# Patient Record
Sex: Male | Born: 1980 | Race: White | Hispanic: No | Marital: Married | Smoking: Current every day smoker
Health system: Southern US, Community
[De-identification: ages and names within clinical notes are randomized; demographics above are authoritative.]

---

## 2007-03-24 ENCOUNTER — Emergency Department (HOSPITAL_COMMUNITY): Admission: EM | Admit: 2007-03-24 | Discharge: 2007-03-24 | Payer: Self-pay | Admitting: Emergency Medicine

## 2008-12-19 IMAGING — CR DG FEMUR 2V*L*
5 series · 5 of 5 positions shown · non-contrast
Comparison: none

CLINICAL DATA: Lateral thigh pain post fall.  
 LEFT FEMUR ? 2 VIEW:

[t femur with hip  ap left]
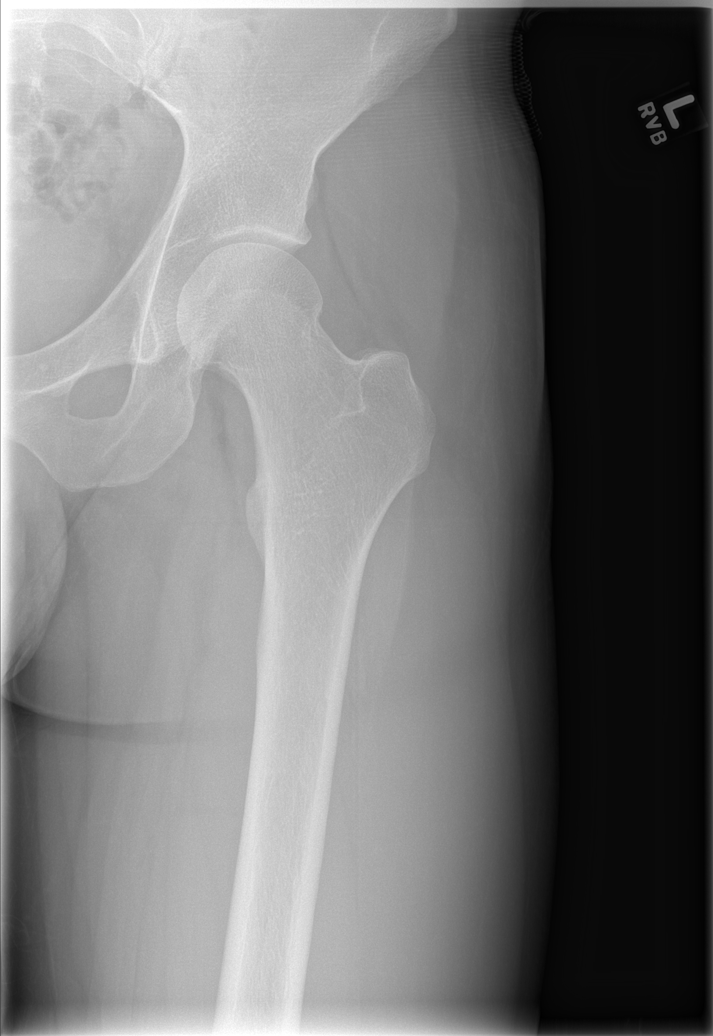

[t femur with knee ap left]
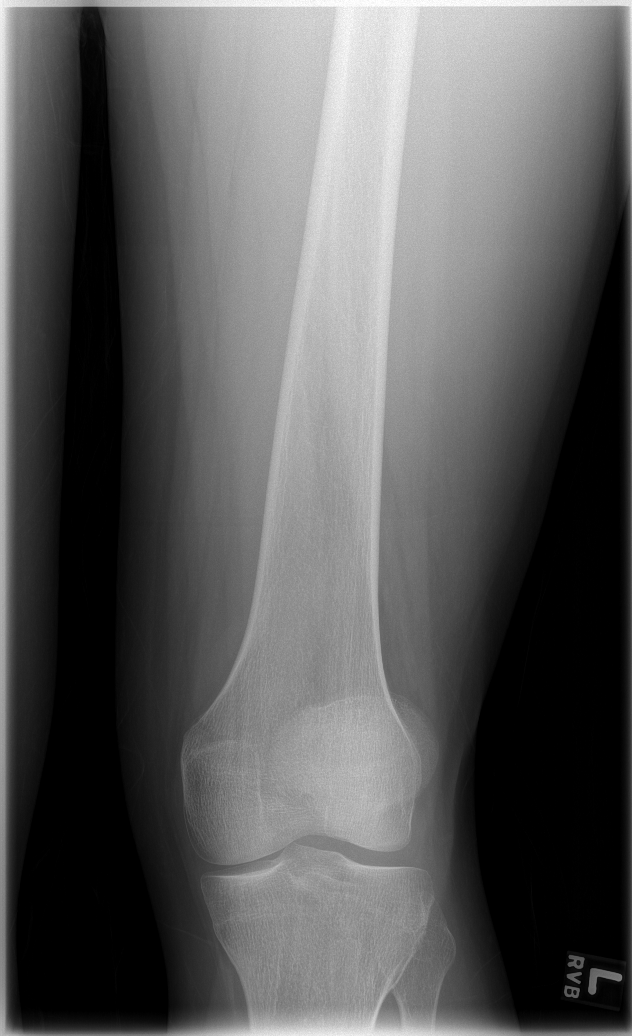

[t femur with hip lat left (1 of 2)]
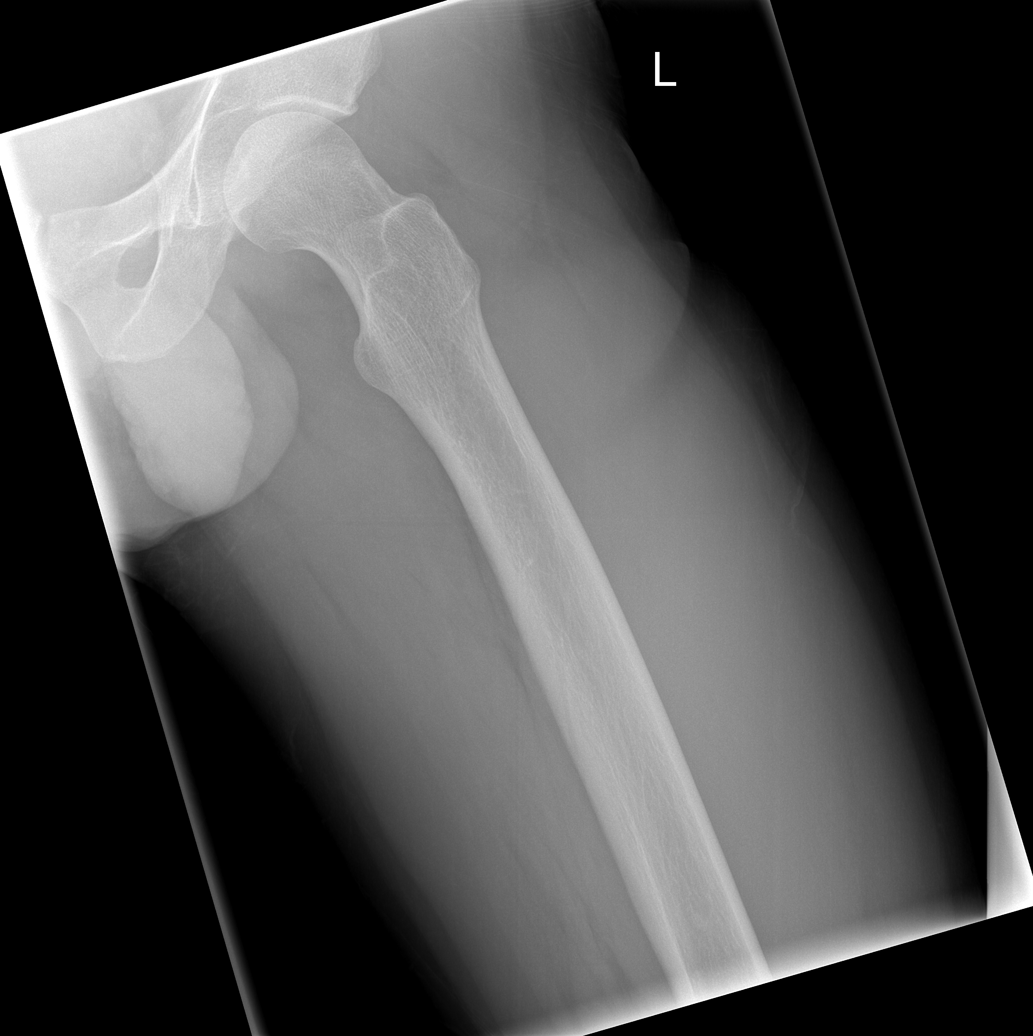

[t femur with hip lat left (2 of 2)]
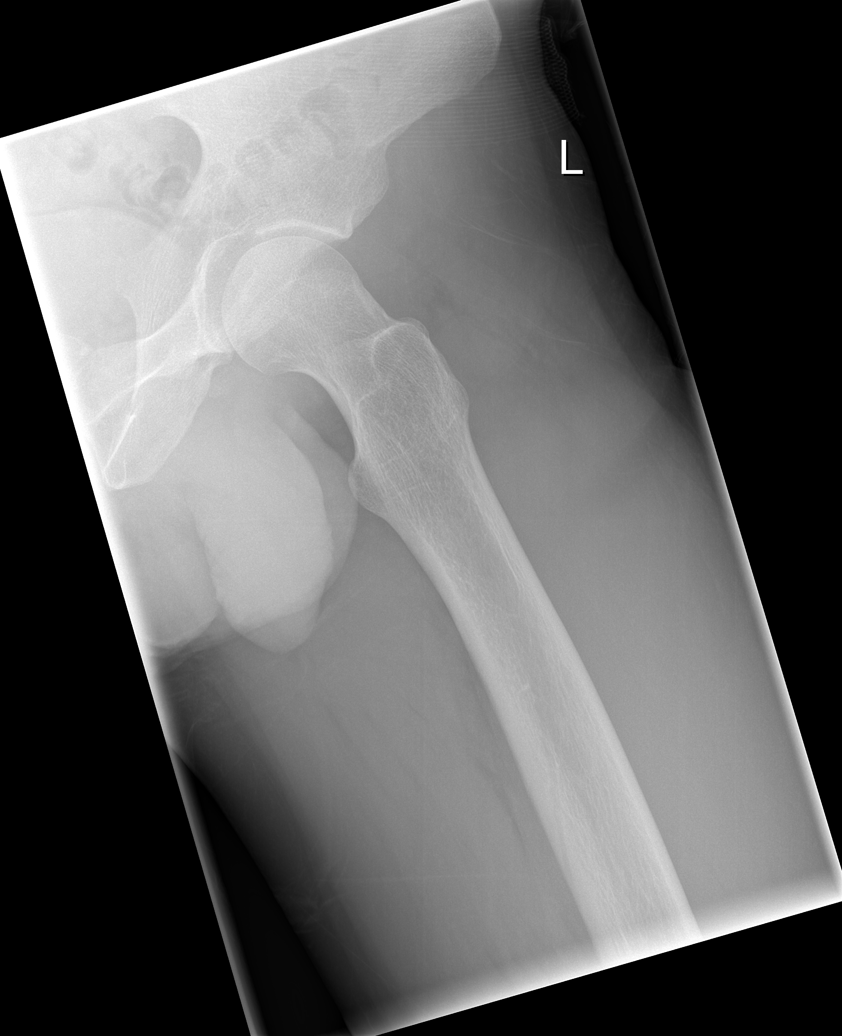

[t femur with knee lat left]
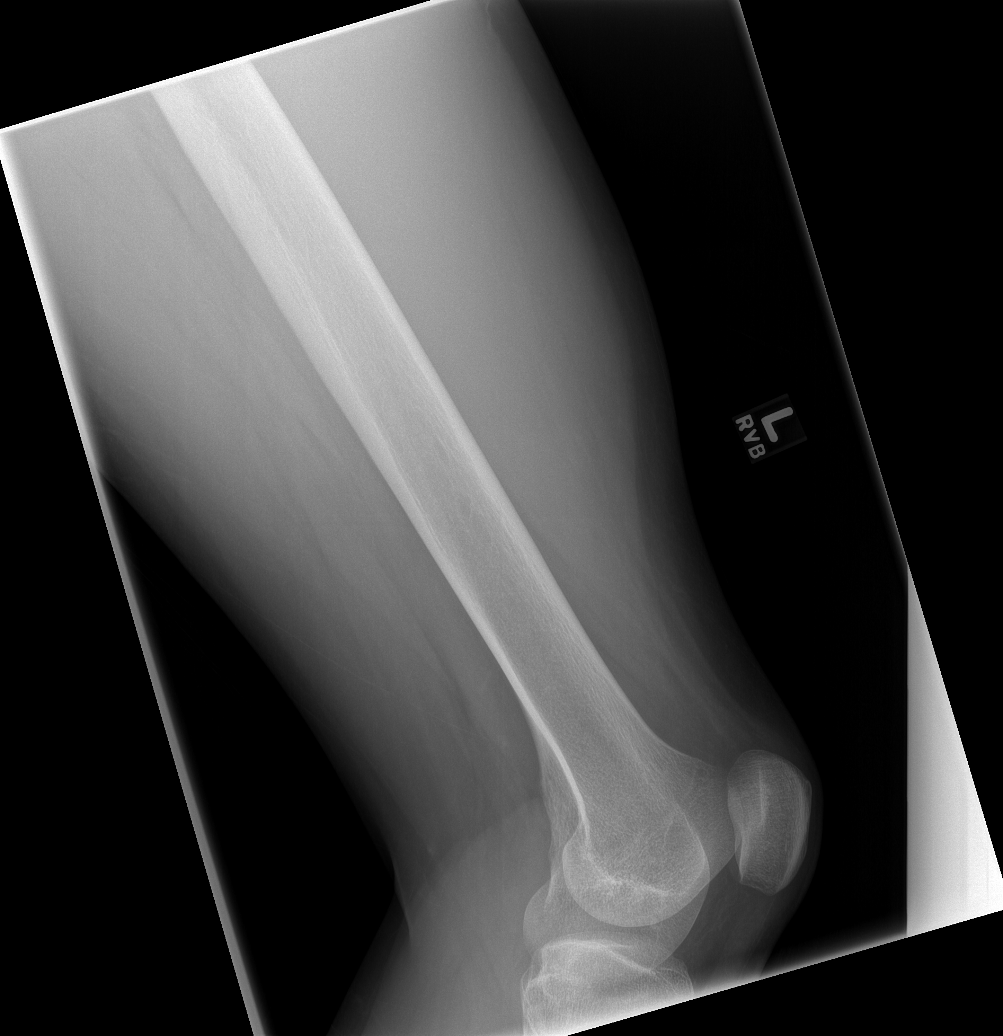

[5 of 5 positions shown; findings below may reference images not displayed]

FINDINGS: There is no evidence of fracture or other focal bone lesions.  Soft tissues are unremarkable.
IMPRESSION: Negative.

## 2011-06-17 ENCOUNTER — Telehealth: Payer: Self-pay | Admitting: Family Medicine

## 2011-06-17 NOTE — Telephone Encounter (Signed)
LEFT MSG

## 2013-02-25 ENCOUNTER — Telehealth: Payer: Self-pay | Admitting: Family Medicine

## 2013-02-25 NOTE — Telephone Encounter (Signed)
tsd  °

## 2013-03-06 ENCOUNTER — Ambulatory Visit (INDEPENDENT_AMBULATORY_CARE_PROVIDER_SITE_OTHER): Payer: BC Managed Care – PPO | Admitting: Family Medicine

## 2013-03-06 ENCOUNTER — Encounter: Payer: Self-pay | Admitting: Family Medicine

## 2013-03-06 ENCOUNTER — Other Ambulatory Visit: Payer: Self-pay

## 2013-03-06 VITALS — BP 120/80 | HR 90 | Ht 75.0 in | Wt 180.0 lb

## 2013-03-06 DIAGNOSIS — F988 Other specified behavioral and emotional disorders with onset usually occurring in childhood and adolescence: Secondary | ICD-10-CM

## 2013-03-06 DIAGNOSIS — F900 Attention-deficit hyperactivity disorder, predominantly inattentive type: Secondary | ICD-10-CM | POA: Insufficient documentation

## 2013-03-06 MED ORDER — AMPHETAMINE-DEXTROAMPHETAMINE 10 MG PO TABS: 10.0000 mg | ORAL_TABLET | Freq: Every day | ORAL | Status: DC

## 2013-03-06 NOTE — Patient Instructions (Signed)
Take the Adderall and I want to know if it works, how long doesn't work and do you have any problems with when you come off

## 2013-03-06 NOTE — Progress Notes (Signed)
  Subjective:    Patient ID: Marcus Cuevas, male    DOB: 1981/03/05, 32 y.o.   MRN: [id number]  HPI He is here for consultation concerning ADD. He was diagnosed by Dr. Delton Prairie as a child and tried on Ritalin as well as Adderall. He apparently did better on Adderall but still had difficulty with withdrawal symptoms when he came off especially becoming irritable. He is now in graduate school and is interested in getting back on medication. He needs coverage for roughly 12 hours due to his schedule. He was seen at the [facility] in testing [facility] for ADD however they could not prescribe the medication. He did bring in information from Dr. Jane Canary. His history indicates more inattention type. He was not hyper and usually remained quite calm but easily distracted.   Review of Systems     Objective:   Physical Exam Alert and in no distress. The information was reviewed.      Assessment & Plan:  ADD (attention deficit hyperactivity disorder, inattentive type) - Plan: amphetamine-dextroamphetamine (ADDERALL) 10 MG tablet  I discussed the use of the medication, if it works, how long and any potential side effects. We'll work with him concerning the appropriate dosing and coverage for 8-12 hours. He will call me next week with information.

## 2013-03-19 ENCOUNTER — Ambulatory Visit: Payer: BC Managed Care – PPO | Admitting: Family Medicine

## 2013-03-20 ENCOUNTER — Ambulatory Visit (INDEPENDENT_AMBULATORY_CARE_PROVIDER_SITE_OTHER): Payer: BC Managed Care – PPO | Admitting: Family Medicine

## 2013-03-20 VITALS — BP 116/78 | HR 59 | Wt 176.0 lb

## 2013-03-20 DIAGNOSIS — F988 Other specified behavioral and emotional disorders with onset usually occurring in childhood and adolescence: Secondary | ICD-10-CM

## 2013-03-20 DIAGNOSIS — F900 Attention-deficit hyperactivity disorder, predominantly inattentive type: Secondary | ICD-10-CM

## 2013-03-20 MED ORDER — AMPHETAMINE-DEXTROAMPHETAMINE 10 MG PO TABS: 10.0000 mg | ORAL_TABLET | Freq: Two times a day (BID) | ORAL | Status: DC

## 2013-03-20 NOTE — Progress Notes (Signed)
  Subjective:    Patient ID: Marcus Cuevas, male    DOB: Apr 14, 1981, 32 y.o.   MRN: [id number]  HPI He is here for consult concerning ADD. He did take the 10 mg dosing however it only lasted 4 hours and then he started taking it twice a day. He does note that he has heat before taking it otherwise he might not need for the whole day. He noticed no withdrawal symptoms at the end of the day.  Review of Systems     Objective:   Physical Exam Alert and in no distress       Assessment & Plan:  ADD (attention deficit hyperactivity disorder, inattentive type) - Plan: amphetamine-dextroamphetamine (ADDERALL) 10 MG tablet, amphetamine-dextroamphetamine (ADDERALL) 10 MG tablet, amphetamine-dextroamphetamine (ADDERALL) 10 MG tablet  he is doing quite well his present medication regimen. He has a good feel for how to use the Adderall and on days he does not need the full dosing he will not take it.

## 2013-07-16 ENCOUNTER — Telehealth: Payer: Self-pay | Admitting: Internal Medicine

## 2013-07-16 ENCOUNTER — Telehealth: Payer: Self-pay | Admitting: Family Medicine

## 2013-07-16 DIAGNOSIS — F900 Attention-deficit hyperactivity disorder, predominantly inattentive type: Secondary | ICD-10-CM

## 2013-07-16 MED ORDER — AMPHETAMINE-DEXTROAMPHETAMINE 10 MG PO TABS: 10.0000 mg | ORAL_TABLET | Freq: Two times a day (BID) | ORAL | Status: DC

## 2013-07-16 NOTE — Telephone Encounter (Signed)
Pt notified that rx's are ready. 

## 2013-07-16 NOTE — Telephone Encounter (Signed)
Adderall renewed.

## 2013-10-21 ENCOUNTER — Telehealth: Payer: Self-pay | Admitting: Family Medicine

## 2013-10-21 DIAGNOSIS — F900 Attention-deficit hyperactivity disorder, predominantly inattentive type: Secondary | ICD-10-CM

## 2013-10-21 MED ORDER — AMPHETAMINE-DEXTROAMPHETAMINE 10 MG PO TABS: 10.0000 mg | ORAL_TABLET | Freq: Two times a day (BID) | ORAL | Status: DC

## 2013-10-21 NOTE — Telephone Encounter (Signed)
lm

## 2013-10-21 NOTE — Telephone Encounter (Signed)
Pt stopped by to request adderall rx. Please call [phone] when ready.

## 2014-02-05 ENCOUNTER — Encounter: Payer: BC Managed Care – PPO | Admitting: Family Medicine

## 2014-03-12 ENCOUNTER — Ambulatory Visit (INDEPENDENT_AMBULATORY_CARE_PROVIDER_SITE_OTHER): Payer: 59 | Admitting: Family Medicine

## 2014-03-12 ENCOUNTER — Encounter: Payer: Self-pay | Admitting: Family Medicine

## 2014-03-12 DIAGNOSIS — F17200 Nicotine dependence, unspecified, uncomplicated: Secondary | ICD-10-CM

## 2014-03-12 NOTE — Progress Notes (Signed)
   Subjective:    Patient ID: Marcus Cuevas, male    DOB: [birth year], 33 y.o.   MRN: [id number]  HPI He is here for smoking consultation. He smokes a pack per day for the last 5 years 3 probably less than a 10-pack-year history. He has tried gum and e cigarette. He notes that boredom as well as anxiety cause him to smoke   Review of Systems     Objective:   Physical Exam Alert and in no distress otherwise not examined       Assessment & Plan:  Current smoker  smoking cessation information given in terms of habits versus addiction. Recommended he look at his smoking history and make a list of things to do instead of smoking for those times and places. Also discussed medications and he is not interested in this. He will set a quit date and return here in roughly one month for followup visit. Information given in the AVS

## 2014-03-12 NOTE — Patient Instructions (Signed)
Smoking Cessation Quitting smoking is important to your health and has many advantages. However, it is not always easy to quit since nicotine is a very addictive drug. Oftentimes, people try 3 times or more before being able to quit. This document explains the best ways for you to prepare to quit smoking. Quitting takes hard work and a lot of effort, but you can do it. ADVANTAGES OF QUITTING SMOKING  You will live longer, feel better, and live better.  Your body will feel the impact of quitting smoking almost immediately.  Within 20 minutes, blood pressure decreases. Your pulse returns to its normal level.  After 8 hours, carbon monoxide levels in the blood return to normal. Your oxygen level increases.  After 24 hours, the chance of having a heart attack starts to decrease. Your breath, hair, and body stop smelling like smoke.  After 48 hours, damaged nerve endings begin to recover. Your sense of taste and smell improve.  After 72 hours, the body is virtually free of nicotine. Your bronchial tubes relax and breathing becomes easier.  After 2 to 12 weeks, lungs can hold more air. Exercise becomes easier and circulation improves.  The risk of having a heart attack, stroke, cancer, or lung disease is greatly reduced.  After 1 year, the risk of coronary heart disease is cut in half.  After 5 years, the risk of stroke falls to the same as a nonsmoker.  After 10 years, the risk of lung cancer is cut in half and the risk of other cancers decreases significantly.  After 15 years, the risk of coronary heart disease drops, usually to the level of a nonsmoker.  If you are pregnant, quitting smoking will improve your chances of having a healthy baby.  The people you live with, especially any children, will be healthier.  You will have extra money to spend on things other than cigarettes. QUESTIONS TO THINK ABOUT BEFORE ATTEMPTING TO QUIT You may want to talk about your answers with your  health care provider.  Why do you want to quit?  If you tried to quit in the past, what helped and what did not?  What will be the most difficult situations for you after you quit? How will you plan to handle them?  Who can help you through the tough times? Your family? Friends? A health care provider?  What pleasures do you get from smoking? What ways can you still get pleasure if you quit? Here are some questions to ask your health care provider:  How can you help me to be successful at quitting?  What medicine do you think would be best for me and how should I take it?  What should I do if I need more help?  What is smoking withdrawal like? How can I get information on withdrawal? GET READY  Set a quit date.  Change your environment by getting rid of all cigarettes, ashtrays, matches, and lighters in your home, car, or work. Do not let people smoke in your home.  Review your past attempts to quit. Think about what worked and what did not. GET SUPPORT AND ENCOURAGEMENT You have a better chance of being successful if you have help. You can get support in many ways.  Tell your family, friends, and coworkers that you are going to quit and need their support. Ask them not to smoke around you.  Get individual, group, or telephone counseling and support. Programs are available at local hospitals and health centers. Call   your local health department for information about programs in your area.  Spiritual beliefs and practices may help some smokers quit.  Download a "quit meter" on your computer to keep track of quit statistics, such as how long you have gone without smoking, cigarettes not smoked, and money saved.  Get a self-help book about quitting smoking and staying off tobacco. LEARN NEW SKILLS AND BEHAVIORS  Distract yourself from urges to smoke. Talk to someone, go for a walk, or occupy your time with a task.  Change your normal routine. Take a different route to work.  Drink tea instead of coffee. Eat breakfast in a different place.  Reduce your stress. Take a hot bath, exercise, or read a book.  Plan something enjoyable to do every day. Reward yourself for not smoking.  Explore interactive web-based programs that specialize in helping you quit. GET MEDICINE AND USE IT CORRECTLY Medicines can help you stop smoking and decrease the urge to smoke. Combining medicine with the above behavioral methods and support can greatly increase your chances of successfully quitting smoking.  Nicotine replacement therapy helps deliver nicotine to your body without the negative effects and risks of smoking. Nicotine replacement therapy includes nicotine gum, lozenges, inhalers, nasal sprays, and skin patches. Some may be available over-the-counter and others require a prescription.  Antidepressant medicine helps people abstain from smoking, but how this works is unknown. This medicine is available by prescription.  Nicotinic receptor partial agonist medicine simulates the effect of nicotine in your brain. This medicine is available by prescription. Ask your health care provider for advice about which medicines to use and how to use them based on your health history. Your health care provider will tell you what side effects to look out for if you choose to be on a medicine or therapy. Carefully read the information on the package. Do not use any other product containing nicotine while using a nicotine replacement product.  RELAPSE OR DIFFICULT SITUATIONS Most relapses occur within the first 3 months after quitting. Do not be discouraged if you start smoking again. Remember, most people try several times before finally quitting. You may have symptoms of withdrawal because your body is used to nicotine. You may crave cigarettes, be irritable, feel very hungry, cough often, get headaches, or have difficulty concentrating. The withdrawal symptoms are only temporary. They are strongest  when you first quit, but they will go away within 10-14 days. To reduce the chances of relapse, try to:  Avoid drinking alcohol. Drinking lowers your chances of successfully quitting.  Reduce the amount of caffeine you consume. Once you quit smoking, the amount of caffeine in your body increases and can give you symptoms, such as a rapid heartbeat, sweating, and anxiety.  Avoid smokers because they can make you want to smoke.  Do not let weight gain distract you. Many smokers will gain weight when they quit, usually less than 10 pounds. Eat a healthy diet and stay active. You can always lose the weight gained after you quit.  Find ways to improve your mood other than smoking. FOR MORE INFORMATION  www.smokefree.gov  Document Released: 05/24/2001 Document Revised: 10/14/2013 Document Reviewed: 09/08/2011 [facility] Patient Information 2015 [place], [place]. This information is not intended to replace advice given to you by your health care provider. Make sure you discuss any questions you have with your health care provider. Identify the triggers. Make a list of things to do instead of smoking. Carried that list with you. Set a quit date.  You can get into a contest with whomever if that'll help but she also to come back and see me. 2 biggest reasons the people start smoking again are that you get stressed or that say I'll just have one. Big no no

## 2014-03-27 ENCOUNTER — Ambulatory Visit: Payer: 59 | Admitting: Family Medicine

## 2014-04-03 ENCOUNTER — Encounter: Payer: Self-pay | Admitting: Family Medicine

## 2014-08-25 ENCOUNTER — Ambulatory Visit (INDEPENDENT_AMBULATORY_CARE_PROVIDER_SITE_OTHER): Payer: 59 | Admitting: Family Medicine

## 2014-08-25 ENCOUNTER — Encounter: Payer: Self-pay | Admitting: Family Medicine

## 2014-08-25 VITALS — BP 120/78 | HR 70 | Wt 180.0 lb

## 2014-08-25 DIAGNOSIS — Z720 Tobacco use: Secondary | ICD-10-CM | POA: Diagnosis not present

## 2014-08-25 DIAGNOSIS — F900 Attention-deficit hyperactivity disorder, predominantly inattentive type: Secondary | ICD-10-CM | POA: Diagnosis not present

## 2014-08-25 DIAGNOSIS — F17200 Nicotine dependence, unspecified, uncomplicated: Secondary | ICD-10-CM | POA: Insufficient documentation

## 2014-08-25 MED ORDER — AMPHETAMINE-DEXTROAMPHETAMINE 10 MG PO TABS: 10.0000 mg | ORAL_TABLET | Freq: Two times a day (BID) | ORAL | Status: DC

## 2014-08-25 NOTE — Progress Notes (Signed)
   Subjective:    Patient ID: Marcus Cuevas, male    DOB: 1980-11-27, 34 y.o.   MRN: [id number]  HPI He is here for a recheck. He does have underlying ADD. He did try to not use his medication but found that he was more easily distracted and unable stay focused. This did cause some trouble at work. He now realizes the need to stay on the medicine regularly especially at work. He has cut down his smoking to 10 cigarettes per day. He just found out that his wife is pregnant and having a little girl. He is going to try to get a job with less travel involved.   Review of Systems     Objective:   Physical Exam Alert and in no distress otherwise not examined      Assessment & Plan:  ADD (attention deficit hyperactivity disorder, inattentive type) - Plan: amphetamine-dextroamphetamine (ADDERALL) 10 MG tablet, amphetamine-dextroamphetamine (ADDERALL) 10 MG tablet, amphetamine-dextroamphetamine (ADDERALL) 10 MG tablet  Current smoker Discussed smoking cessation with him. Reinforced the fact that at this point the smoking is more psychological triggers and truly addiction. He will attempt to continue to cut back. Recommend he come in for complete exam in sometime in the near future.

## 2015-01-14 ENCOUNTER — Telehealth: Payer: Self-pay | Admitting: Family Medicine

## 2015-01-14 DIAGNOSIS — F900 Attention-deficit hyperactivity disorder, predominantly inattentive type: Secondary | ICD-10-CM

## 2015-01-14 NOTE — Telephone Encounter (Signed)
Pt called requesting a refill on his ADDERALL med. Call pt at [phone], when pres  ready

## 2015-01-15 MED ORDER — AMPHETAMINE-DEXTROAMPHETAMINE 10 MG PO TABS: 10.0000 mg | ORAL_TABLET | Freq: Two times a day (BID) | ORAL | Status: DC

## 2015-02-10 ENCOUNTER — Encounter: Payer: Self-pay | Admitting: Family Medicine

## 2015-02-10 ENCOUNTER — Ambulatory Visit (INDEPENDENT_AMBULATORY_CARE_PROVIDER_SITE_OTHER): Payer: 59 | Admitting: Family Medicine

## 2015-02-10 VITALS — BP 112/60 | HR 65 | Ht 74.0 in | Wt 169.6 lb

## 2015-02-10 DIAGNOSIS — Z720 Tobacco use: Secondary | ICD-10-CM | POA: Diagnosis not present

## 2015-02-10 DIAGNOSIS — J3089 Other allergic rhinitis: Secondary | ICD-10-CM | POA: Diagnosis not present

## 2015-02-10 DIAGNOSIS — F17200 Nicotine dependence, unspecified, uncomplicated: Secondary | ICD-10-CM

## 2015-02-10 DIAGNOSIS — Z0000 Encounter for general adult medical examination without abnormal findings: Secondary | ICD-10-CM | POA: Diagnosis not present

## 2015-02-10 DIAGNOSIS — Z23 Encounter for immunization: Secondary | ICD-10-CM | POA: Diagnosis not present

## 2015-02-10 DIAGNOSIS — F900 Attention-deficit hyperactivity disorder, predominantly inattentive type: Secondary | ICD-10-CM

## 2015-02-10 NOTE — Progress Notes (Signed)
Subjective:    Patient ID: Marcus Cuevas, male    DOB: 11-09-1980, 34 y.o.   MRN: [id number]  HPI He is here for complete examination. He does have underlying ADD and is doing quite well on his present medication regimen. He does take it twice per day. He has no withdrawal symptoms in between dosing. He does smoke and at this time is not ready to quit. His allergies are under good control using Zyrtec. He is a new father of a bouncing baby girl! He is in [employer] and presently is involved in [employer] near [place].He did have recent blood work done through [facility] which he will bring in. Apparently it was normal. Family and social history was reviewed as well as health maintenance and immunizations. He has no chest pain, GI symptoms.   Review of Systems  All other systems reviewed and are negative.      Objective:   Physical Exam BP 112/60 mmHg  Pulse 65  Ht 6\' 2"  (1.88 m)  Wt 169 lb 9.6 oz (76.93 kg)  BMI 21.77 kg/m2  SpO2 98%  General Appearance:    Alert, cooperative, no distress, appears stated age  Head:    Normocephalic, without obvious abnormality, atraumatic  Eyes:    PERRL, conjunctiva/corneas clear, EOM's intact, fundi    benign  Ears:    Normal TM's and external ear canals  Nose:   Nares normal, mucosa normal, no drainage or sinus   tenderness  Throat:   Lips, mucosa, and tongue normal; teeth and gums normal  Neck:   Supple, no lymphadenopathy;  thyroid:  no   enlargement/tenderness/nodules; no carotid   bruit or JVD  Back:    Spine nontender, no curvature, ROM normal, no CVA     tenderness  Lungs:     Clear to auscultation bilaterally without wheezes, rales or     ronchi; respirations unlabored  Chest Wall:    No tenderness or deformity   Heart:    Regular rate and rhythm, S1 and S2 normal, no murmur, rub   or gallop  Breast Exam:    No chest wall tenderness, masses or gynecomastia  Abdomen:     Soft, non-tender, nondistended, normoactive bowel sounds,    no masses,  no hepatosplenomegaly  Genitalia:    Normal male external genitalia without lesions.  Testicles without masses.  No inguinal hernias.  Rectal:   Deferred due to age <40 and lack of symptoms  Extremities:   No clubbing, cyanosis or edema  Pulses:   2+ and symmetric all extremities  Skin:   Skin color, texture, turgor normal, no rashes or lesions  Lymph nodes:   Cervical, supraclavicular, and axillary nodes normal  Neurologic:   CNII-XII intact, normal strength, sensation and gait; reflexes 2+ and symmetric throughout          Psych:   Normal mood, affect, hygiene and grooming.          Assessment & Plan:  ADD (attention deficit hyperactivity disorder, inattentive type)  Current smoker  Need for prophylactic vaccination and inoculation against influenza - Plan: Flu Vaccine QUAD 36+ mos IM  Other allergic rhinitis At this time he is not ready to quit smoking and encouraged him to quit when he is ready and I will work with him.( child-rearing with him. He seems to have a good handle on it. His comment to me was his parents told him to make himself as useless as quick as possible in regard to  raising a child. Flu shot given with risks and benefits discussed.

## 2015-02-17 ENCOUNTER — Encounter: Payer: Self-pay | Admitting: Family Medicine

## 2015-03-04 ENCOUNTER — Ambulatory Visit (INDEPENDENT_AMBULATORY_CARE_PROVIDER_SITE_OTHER): Payer: 59 | Admitting: Family Medicine

## 2015-03-04 DIAGNOSIS — Z720 Tobacco use: Secondary | ICD-10-CM

## 2015-03-04 DIAGNOSIS — F17200 Nicotine dependence, unspecified, uncomplicated: Secondary | ICD-10-CM

## 2015-03-04 DIAGNOSIS — Z8349 Family history of other endocrine, nutritional and metabolic diseases: Secondary | ICD-10-CM

## 2015-03-04 NOTE — Progress Notes (Signed)
   Subjective:    Patient ID: Marcus Cuevas, male    DOB: [birth year], 34 y.o.   MRN: [id number]  HPI He is here for consultation. There is a positive family  history of Hemochromatosis. He would like to have testing done for this. He also is here for smoking consultation. He apparently needs this to get a lower insurance rate. He readily admits that he is not ready to quit smoking. He has a 97-month-old son at home which has him quite stressed. He plans to quit in one year. Presently is smoking 1 pack per day. He is not interested in using medication to help with this.   Review of Systems     Objective:   Physical Exam Alert and in no distress otherwise not examined       Assessment & Plan:  Family history of hemochromatosis  Current smoker A prescription was written for him to get CBC, Cmet, iron and ferritin. He will have this information sent to me. We also then discussed smoking cessation. Discussed habit versus addiction. Also discussed positive reinforcement to help with smoking cessation and recommend he return here when he is truly ready to quit smoking.

## 2015-03-19 ENCOUNTER — Telehealth: Payer: Self-pay | Admitting: Family Medicine

## 2015-03-19 NOTE — Telephone Encounter (Signed)
Let him know that the form is ready to pick up

## 2015-03-19 NOTE — Telephone Encounter (Signed)
Pt dropped of a health screening form to be filled out from you, pt can be reached at [phone] (M) to let know when ready to be ready to be picked up, put your folder,

## 2015-03-23 NOTE — Telephone Encounter (Signed)
Left message with wife that form ready for pick up

## 2015-04-01 ENCOUNTER — Telehealth: Payer: Self-pay | Admitting: Family Medicine

## 2015-04-01 NOTE — Telephone Encounter (Signed)
Pt came in and dropped off his health screening form from his employer. He stated they would not except the part under section c that is required. He states that his employer wanted specifics on a plan for quitting smoking. I am sending form back to be addressed.

## 2015-04-03 NOTE — Telephone Encounter (Signed)
Form ready for pick up, pt informed

## 2015-04-13 ENCOUNTER — Telehealth: Payer: Self-pay | Admitting: Family Medicine

## 2015-04-13 DIAGNOSIS — F900 Attention-deficit hyperactivity disorder, predominantly inattentive type: Secondary | ICD-10-CM

## 2015-04-13 MED ORDER — AMPHETAMINE-DEXTROAMPHETAMINE 10 MG PO TABS: 10.0000 mg | ORAL_TABLET | Freq: Two times a day (BID) | ORAL | Status: DC

## 2015-04-13 NOTE — Telephone Encounter (Signed)
Pt called and requested refills on Adderall . Call when ready.

## 2015-07-20 ENCOUNTER — Telehealth: Payer: Self-pay | Admitting: Medical

## 2015-07-20 NOTE — Telephone Encounter (Signed)
Pt states has been on ADD meds since was a kid, has tried Ritalin & Adderall XR Switched pharmacy to [facility] My meds states approved, pt informed

## 2015-08-28 ENCOUNTER — Encounter: Payer: Self-pay | Admitting: Family Medicine

## 2015-08-28 ENCOUNTER — Ambulatory Visit (INDEPENDENT_AMBULATORY_CARE_PROVIDER_SITE_OTHER): Payer: 59 | Admitting: Family Medicine

## 2015-08-28 VITALS — BP 130/82 | HR 78 | Ht 74.0 in | Wt 174.0 lb

## 2015-08-28 DIAGNOSIS — F17200 Nicotine dependence, unspecified, uncomplicated: Secondary | ICD-10-CM

## 2015-08-28 DIAGNOSIS — Z720 Tobacco use: Secondary | ICD-10-CM

## 2015-08-28 DIAGNOSIS — F900 Attention-deficit hyperactivity disorder, predominantly inattentive type: Secondary | ICD-10-CM

## 2015-08-28 MED ORDER — AMPHETAMINE-DEXTROAMPHETAMINE 10 MG PO TABS: 10.0000 mg | ORAL_TABLET | Freq: Two times a day (BID) | ORAL | Status: DC

## 2015-08-28 NOTE — Progress Notes (Signed)
   Subjective:    Patient ID: Marcus Cuevas, male    DOB: 1981-03-06, 35 y.o.   MRN: [id number]  HPI He is for recheck on his ADD. He takes 1 pill in the morning and a half a pill in the afternoon and gets 8 hours worth of benefit out of this. He also supplements he afternoon dosing with caffeine. He is very comfortable with this but might consider making a change when he goes back to school. He also is considering quitting smoking sometime this year. He is researching this on line and seems to have a good understanding of this. He does not smoke in his house, car or around his 6162-month-old child.   Review of Systems     Objective:   Physical Exam Alert and in no distress otherwise not examined      Assessment & Plan:  ADD (attention deficit hyperactivity disorder, inattentive type) - Plan: amphetamine-dextroamphetamine (ADDERALL) 10 MG tablet, amphetamine-dextroamphetamine (ADDERALL) 10 MG tablet, amphetamine-dextroamphetamine (ADDERALL) 10 MG tablet  Current smoker his Adderall was renewed. 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 Encouraged him to call if he has difficulty with the smoking. Discussed habit versus addiction with him.

## 2015-12-09 ENCOUNTER — Encounter: Payer: Self-pay | Admitting: Family Medicine

## 2015-12-09 ENCOUNTER — Ambulatory Visit (INDEPENDENT_AMBULATORY_CARE_PROVIDER_SITE_OTHER): Payer: 59 | Admitting: Family Medicine

## 2015-12-09 VITALS — BP 118/80 | HR 71 | Wt 181.0 lb

## 2015-12-09 DIAGNOSIS — F17200 Nicotine dependence, unspecified, uncomplicated: Secondary | ICD-10-CM

## 2015-12-09 DIAGNOSIS — F900 Attention-deficit hyperactivity disorder, predominantly inattentive type: Secondary | ICD-10-CM | POA: Diagnosis not present

## 2015-12-09 DIAGNOSIS — Z720 Tobacco use: Secondary | ICD-10-CM

## 2015-12-09 MED ORDER — AMPHETAMINE-DEXTROAMPHETAMINE 10 MG PO TABS: ORAL_TABLET | ORAL | Status: DC

## 2015-12-09 NOTE — Progress Notes (Signed)
   Subjective:    Patient ID: Marcus Cuevas, male    DOB: 07-May-1981, 35 y.o.   MRN: [id number]  HPI He is here for medication management. He continues on Adderall. He is using 1-1/2 in the morning and 1 at lunchtime he gets 8 hours of benefit out of this. He has no problems with eating or withdrawal symptoms. He is very happy with the present dosing regimen. He continues to smoke and is contemplating quitting but not ready yet. He does not smoke in the house or around his 10955-month-old daughter.   Review of Systems     Objective:   Physical Exam Alert and in no distress otherwise not examined       Assessment & Plan:  ADD (attention deficit hyperactivity disorder, inattentive type) - Plan: amphetamine-dextroamphetamine (ADDERALL) 10 MG tablet, amphetamine-dextroamphetamine (ADDERALL) 10 MG tablet, amphetamine-dextroamphetamine (ADDERALL) 10 MG tablet  Current smoker His medications were renewed. Discussed smoking cessation with him and encouraged him to call me when he is ready and I will work with him.

## 2016-03-21 ENCOUNTER — Telehealth: Payer: Self-pay | Admitting: Family Medicine

## 2016-03-21 MED ORDER — AMPHETAMINE-DEXTROAMPHETAMINE 10 MG PO TABS
ORAL_TABLET | ORAL | 0 refills | Status: DC
Start: 2016-04-21 — End: 2016-08-12

## 2016-03-21 MED ORDER — AMPHETAMINE-DEXTROAMPHETAMINE 10 MG PO TABS: ORAL_TABLET | ORAL | 0 refills | Status: DC

## 2016-03-21 NOTE — Telephone Encounter (Signed)
Requesting refill on Adderall. Call pt at [phone] when scripts ready for pick up

## 2016-03-23 ENCOUNTER — Encounter: Payer: Self-pay | Admitting: Family Medicine

## 2016-03-23 ENCOUNTER — Ambulatory Visit (INDEPENDENT_AMBULATORY_CARE_PROVIDER_SITE_OTHER): Payer: 59 | Admitting: Family Medicine

## 2016-03-23 VITALS — BP 112/70 | HR 103 | Wt 168.0 lb

## 2016-03-23 DIAGNOSIS — F17200 Nicotine dependence, unspecified, uncomplicated: Secondary | ICD-10-CM

## 2016-03-23 NOTE — Progress Notes (Signed)
   Subjective:    Patient ID: Marcus Cuevas, male    DOB: 06-Jul-1980, 35 y.o.   MRN: [id number]  HPI He is here for consult concerning smoking cessation. 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 He has not set up a plan for this. He uses smoking as a stress reducer but then further questioning indicates that he realizes that he is to set this up as a way to remove himself from having to deal with people. He recognizes the need to occasionally get away from his coworkers and off on his own and uses cigarette smoking as the crutch to do this.   Review of Systems     Objective:   Physical Exam Alert and in no distress otherwise not examined       Assessment & Plan:  Current smoker  I discussed habit versus addiction with him. I'll though he smokes a pack per day and not sure that he truly has an addiction to the nicotine. He so my recognizes the fact that he is using this mainly as a way to get away from people to have a break but not necessarily due to an unusual amount of stress. I discussed the normal stresses of working versus distress. He admits that this is not really distress. Recommend that he return the next year when he is ready to quit smoking and I will work with him on again plan. A form was filled out explaining this.

## 2016-05-17 ENCOUNTER — Encounter: Payer: 59 | Admitting: Family Medicine

## 2016-08-12 ENCOUNTER — Ambulatory Visit (INDEPENDENT_AMBULATORY_CARE_PROVIDER_SITE_OTHER): Payer: 59 | Admitting: Family Medicine

## 2016-08-12 ENCOUNTER — Encounter: Payer: Self-pay | Admitting: Family Medicine

## 2016-08-12 VITALS — BP 110/70 | HR 76 | Ht 74.0 in | Wt 181.0 lb

## 2016-08-12 DIAGNOSIS — F17200 Nicotine dependence, unspecified, uncomplicated: Secondary | ICD-10-CM | POA: Diagnosis not present

## 2016-08-12 DIAGNOSIS — F900 Attention-deficit hyperactivity disorder, predominantly inattentive type: Secondary | ICD-10-CM

## 2016-08-12 MED ORDER — AMPHETAMINE-DEXTROAMPHETAMINE 10 MG PO TABS: ORAL_TABLET | ORAL | 0 refills | Status: DC

## 2016-08-12 NOTE — Progress Notes (Signed)
   Subjective:    Patient ID: Marcus Cuevas, male    DOB: 10/29/1980, 36 y.o.   MRN: [id number]  HPI He is here for recheck. He has had ADHD since childhood. He has been doing quite nicely on 1-1/2 in the morning and 1 at bedtime. He notes that if he goes to 1-1/2 twice per day, he will have difficulty with sleep at night. He is very happy with his present dosing regimen. He continues to smoke but states he has cut back.  Review of Systems     Objective:   Physical Exam Alert and in no distress otherwise not examined       Assessment & Plan:  Current smoker  Attention deficit hyperactivity disorder (ADHD), predominantly inattentive type  I again stated that I would work with him concerning smoking when he is ready. I will renew his medication. He will check on Adderall XR coverage and we might possibly switch him to that.

## 2016-08-12 NOTE — Patient Instructions (Signed)
Check on the cost of Adderall XR 20 mg

## 2016-08-13 ENCOUNTER — Telehealth: Payer: Self-pay | Admitting: Family Medicine

## 2016-08-13 NOTE — Telephone Encounter (Signed)
P.A. AMPHETAMINE-DEX  

## 2016-08-21 NOTE — Telephone Encounter (Signed)
P.A. Approved, left message for pt, faxed pharmacy  °

## 2016-11-14 ENCOUNTER — Telehealth: Payer: Self-pay | Admitting: Family Medicine

## 2016-11-14 MED ORDER — AMPHETAMINE-DEXTROAMPHETAMINE 10 MG PO TABS: ORAL_TABLET | ORAL | 0 refills | Status: DC

## 2016-11-14 NOTE — Telephone Encounter (Signed)
Pt called and is requesting a refill on his adderall pt can be reached at [phone] please call when ready to be picked up

## 2017-04-03 ENCOUNTER — Ambulatory Visit (INDEPENDENT_AMBULATORY_CARE_PROVIDER_SITE_OTHER): Payer: 59 | Admitting: Family Medicine

## 2017-04-03 ENCOUNTER — Encounter: Payer: Self-pay | Admitting: Family Medicine

## 2017-04-03 VITALS — BP 110/78 | HR 91 | Ht 75.0 in | Wt 181.6 lb

## 2017-04-03 DIAGNOSIS — F17200 Nicotine dependence, unspecified, uncomplicated: Secondary | ICD-10-CM

## 2017-04-03 DIAGNOSIS — F900 Attention-deficit hyperactivity disorder, predominantly inattentive type: Secondary | ICD-10-CM | POA: Diagnosis not present

## 2017-04-03 MED ORDER — LISDEXAMFETAMINE DIMESYLATE 50 MG PO CAPS: 50.0000 mg | ORAL_CAPSULE | Freq: Every day | ORAL | 0 refills | Status: DC

## 2017-04-03 NOTE — Progress Notes (Signed)
   Subjective:    Patient ID: Jorje GuildJohn Thien, male    DOB: 03-Apr-1981, 36 y.o.   MRN: [id number]  HPI He is here for a medication check. He does state Adderall does s focus but iake him slightly nervous which causes his now on the process of trying to break this. He has stopped drinking alcohol and has made some dietary changes to ith that. He is also stopped his Adderall but notes difficulty with his focus.he is interested in alternatives. His work and home life are going well.   Review of Systems     Objective:   Physical Exam Alert and in no distress otherwise not examined       Assessment & Plan:  Attention deficit hyperactivity disorder (ADHD), predominantly inattentive type - Plan: lisdexamfetamine (VYVANSE) 50 MG capsule  Current smoker I discussed smoking cessation with him in regard to his ADD medication. I will switch him to Vyvanse and see how he tolerates this. He will let me know if it works, how long and if any difficulties. I will also work with him concerning smoking cessation.

## 2017-04-03 NOTE — Patient Instructions (Signed)
Need to know if it works, how long it works and if you have any trouble.

## 2017-05-15 ENCOUNTER — Encounter: Payer: 59 | Admitting: Family Medicine

## 2017-05-27 ENCOUNTER — Telehealth: Payer: Self-pay | Admitting: Family Medicine

## 2017-06-17 NOTE — Telephone Encounter (Signed)
P.A. Approved til 05/27/18, pt informed, already picked up

## 2017-07-10 ENCOUNTER — Encounter: Payer: Self-pay | Admitting: Family Medicine

## 2017-07-10 ENCOUNTER — Ambulatory Visit: Payer: 59 | Admitting: Family Medicine

## 2017-07-10 VITALS — BP 118/78 | HR 72 | Wt 188.2 lb

## 2017-07-10 DIAGNOSIS — F900 Attention-deficit hyperactivity disorder, predominantly inattentive type: Secondary | ICD-10-CM

## 2017-07-10 MED ORDER — AMPHETAMINE-DEXTROAMPHETAMINE 10 MG PO TABS: ORAL_TABLET | ORAL | 0 refills | Status: DC

## 2017-07-10 NOTE — Progress Notes (Signed)
   Subjective:    Patient ID: Marcus Cuevas, male    DOB: Oct 12, 1980, 37 y.o.   MRN: [id number]  HPI He is here for consultation concerning his underlying ADD.  He did try Vyvanse but found it very difficult to use and that he had erratic symptoms from it and decided to stop taking it.  He would like to revert back to his old dosing regimen that he has much better predictability with.   Review of Systems     Objective:   Physical Exam Alert and in no distress otherwise not examined      Assessment & Plan:  Attention deficit hyperactivity disorder (ADHD), predominantly inattentive type  His Adderall was renewed at the previous dosing regimen.  Also encouraged him to come in for complete examination.

## 2017-08-11 ENCOUNTER — Telehealth: Payer: Self-pay | Admitting: Family Medicine

## 2017-08-11 NOTE — Telephone Encounter (Signed)
Called pt to let him know . Thanks [employer]

## 2017-08-11 NOTE — Telephone Encounter (Signed)
Pt needs refill Adderall [facility]

## 2017-08-11 NOTE — Telephone Encounter (Signed)
The prescription should be at the drugstore already.

## 2017-08-14 NOTE — Telephone Encounter (Signed)
P.A. AMPHETAMINE-DEXTROAMPHETAMINE  °

## 2017-08-31 ENCOUNTER — Encounter: Payer: Managed Care, Other (non HMO) | Admitting: Family Medicine

## 2017-09-01 ENCOUNTER — Encounter: Payer: Self-pay | Admitting: Family Medicine

## 2017-09-18 ENCOUNTER — Telehealth: Payer: Self-pay

## 2017-09-18 MED ORDER — AMPHETAMINE-DEXTROAMPHETAMINE 10 MG PO TABS: ORAL_TABLET | ORAL | 0 refills | Status: DC

## 2017-09-18 NOTE — Telephone Encounter (Signed)
Pt is working out of town and is requesting [facility]all [facility] [address] [place]. [phone]. I have called and made sure he did not pick up the script here and the pharmacy staff conformed that he did not and they will delete the one they have so the new script [facility] [place]. Thanks [employer]

## 2017-10-09 ENCOUNTER — Telehealth: Payer: Self-pay | Admitting: Family Medicine

## 2017-10-09 NOTE — Telephone Encounter (Signed)
P.A. ADDERALL approval til 08/15/18 for 2 1/2 per day #75/30

## 2017-10-25 ENCOUNTER — Telehealth: Payer: Self-pay | Admitting: Family Medicine

## 2017-10-25 DIAGNOSIS — F900 Attention-deficit hyperactivity disorder, predominantly inattentive type: Secondary | ICD-10-CM

## 2017-10-25 NOTE — Telephone Encounter (Signed)
Pt come in and is needing a refill on his adderall pt [facility]  [id number] [place], [place] - [address] pt can be reached at [phone], he is wanting to know if and when he needs to come in for a medcheck  Informed pt that you was out of the office today

## 2017-10-26 ENCOUNTER — Telehealth: Payer: Self-pay

## 2017-10-26 MED ORDER — LISDEXAMFETAMINE DIMESYLATE 50 MG PO CAPS: 50.0000 mg | ORAL_CAPSULE | Freq: Every day | ORAL | 0 refills | Status: DC

## 2017-10-26 MED ORDER — AMPHETAMINE-DEXTROAMPHETAMINE 10 MG PO TABS: ORAL_TABLET | ORAL | 0 refills | Status: DC

## 2017-10-26 MED ORDER — LISDEXAMFETAMINE DIMESYLATE 50 MG PO CAPS
50.0000 mg | ORAL_CAPSULE | Freq: Every day | ORAL | 0 refills | Status: DC
Start: 2017-11-26 — End: 2017-10-26

## 2017-10-26 NOTE — Telephone Encounter (Signed)
Let him know he does not need to come in until January.

## 2017-10-26 NOTE — Telephone Encounter (Signed)
Vyvanse was called in by mistake.  I will switch him to Adderall which he states works better.

## 2017-10-26 NOTE — Telephone Encounter (Signed)
Pt is currently out of his adderall and need a refill [facility] [address]. Thanks [employer]

## 2017-10-26 NOTE — Telephone Encounter (Signed)
Patient called states that he wanted the Adderall 10 mg refilled not the vyvanse.   Can you please send to the same pharmacy [facility] [address]

## 2018-01-25 ENCOUNTER — Telehealth: Payer: Self-pay | Admitting: Family Medicine

## 2018-01-25 MED ORDER — AMPHETAMINE-DEXTROAMPHETAMINE 10 MG PO TABS: ORAL_TABLET | ORAL | 0 refills | Status: DC

## 2018-01-25 NOTE — Telephone Encounter (Signed)
Pt called and is requesting a refill on his adderall pt made a medcheck appt for sept 09/2017, pt uses [facility] [phone] - [place], [place] - [address] and pt can be reached at [phone]

## 2018-02-14 ENCOUNTER — Encounter: Payer: Managed Care, Other (non HMO) | Admitting: Family Medicine

## 2018-02-26 ENCOUNTER — Encounter: Payer: Self-pay | Admitting: Family Medicine

## 2018-02-26 ENCOUNTER — Ambulatory Visit: Payer: Managed Care, Other (non HMO) | Admitting: Family Medicine

## 2018-02-26 VITALS — BP 110/72 | HR 67 | Temp 98.0°F | Ht 74.0 in | Wt 173.6 lb

## 2018-02-26 DIAGNOSIS — F17200 Nicotine dependence, unspecified, uncomplicated: Secondary | ICD-10-CM | POA: Diagnosis not present

## 2018-02-26 DIAGNOSIS — F900 Attention-deficit hyperactivity disorder, predominantly inattentive type: Secondary | ICD-10-CM | POA: Diagnosis not present

## 2018-02-26 DIAGNOSIS — J3089 Other allergic rhinitis: Secondary | ICD-10-CM

## 2018-02-26 MED ORDER — AMPHETAMINE-DEXTROAMPHETAMINE 10 MG PO TABS: ORAL_TABLET | ORAL | 0 refills | Status: DC

## 2018-02-26 NOTE — Progress Notes (Signed)
   Subjective:    Patient ID: Marcus Cuevas, male    DOB: 09-Jul-1980, 37 y.o.   MRN: [id number]  HPI He is here for medication management visit.  He does have underlying ADD and is using Adderall 1-1/2 pills in the morning and 1 in the afternoon.  He finds that the dosing like this does help him get through the day.  He is most in need of focus in the morning.  He does note slight irritability at the end of 5 hours.  He does have underlying allergies and is using Zyrtec for that.  He also smokes but is not interested in quitting.   Review of Systems     Objective:   Physical Exam Alert and in no distress. Tympanic membranes and canals are normal. Pharyngeal area is normal. Neck is supple without adenopathy or thyromegaly. Cardiac exam shows a regular sinus rhythm without murmurs or gallops. Lungs are clear to auscultation.        Assessment & Plan:  Attention deficit hyperactivity disorder (ADHD), predominantly inattentive type  Current smoker  Other allergic rhinitis Is Adderall was renewed.  He will continue on his present Zyrtec dosing.  Discussed smoking cessation with him and he is not interested in quitting.  Flu shot offered but he refused.

## 2018-05-30 ENCOUNTER — Telehealth: Payer: Self-pay | Admitting: Family Medicine

## 2018-05-30 MED ORDER — AMPHETAMINE-DEXTROAMPHETAMINE 10 MG PO TABS: ORAL_TABLET | ORAL | 0 refills | Status: DC

## 2018-05-30 NOTE — Telephone Encounter (Signed)
Pt called for refills of Adderall. [facility] [address]. Pt can be reached at [phone].

## 2018-08-21 ENCOUNTER — Encounter: Payer: Self-pay | Admitting: Family Medicine

## 2018-08-21 ENCOUNTER — Ambulatory Visit: Payer: BLUE CROSS/BLUE SHIELD | Admitting: Family Medicine

## 2018-08-21 VITALS — BP 118/80 | HR 71 | Temp 98.0°F | Wt 174.8 lb

## 2018-08-21 DIAGNOSIS — F17200 Nicotine dependence, unspecified, uncomplicated: Secondary | ICD-10-CM

## 2018-08-21 DIAGNOSIS — F900 Attention-deficit hyperactivity disorder, predominantly inattentive type: Secondary | ICD-10-CM | POA: Diagnosis not present

## 2018-08-21 MED ORDER — AMPHETAMINE-DEXTROAMPHETAMINE 10 MG PO TABS: ORAL_TABLET | ORAL | 0 refills | Status: DC

## 2018-08-21 NOTE — Progress Notes (Signed)
   Subjective:    Patient ID: Marcus Cuevas, male    DOB: 1980-10-29, 38 y.o.   MRN: [id number]  HPI He is here for a consult concerning his ADHD.  He has been very stable on Adderall on his present dosing regimen.  He usually gets roughly 6 hours of the first dose and a second dose covers him until he gets home.  He does have some slight anorexia while he is on the medication but this does not bother him.  Otherwise he is doing quite well.  He does continue to smoke roughly half a pack per day and is not ready to quit.   Review of Systems     Objective:   Physical Exam Alert and in no distress otherwise not examined       Assessment & Plan:  Attention deficit hyperactivity disorder (ADHD), predominantly inattentive type - Plan: amphetamine-dextroamphetamine (ADDERALL) 10 MG tablet, amphetamine-dextroamphetamine (ADDERALL) 10 MG tablet, amphetamine-dextroamphetamine (ADDERALL) 10 MG tablet  Current smoker I did not discuss his smoking since he is really not ready to quit.  Did recommend he come in later this year for complete examination.

## 2019-01-01 ENCOUNTER — Other Ambulatory Visit: Payer: Self-pay

## 2019-01-01 DIAGNOSIS — F900 Attention-deficit hyperactivity disorder, predominantly inattentive type: Secondary | ICD-10-CM

## 2019-01-01 MED ORDER — AMPHETAMINE-DEXTROAMPHETAMINE 10 MG PO TABS: ORAL_TABLET | ORAL | 0 refills | Status: DC

## 2019-01-01 NOTE — Telephone Encounter (Signed)
Pt called and is requesting a refill on the following medication.

## 2019-02-21 ENCOUNTER — Encounter: Payer: BLUE CROSS/BLUE SHIELD | Admitting: Family Medicine

## 2019-03-07 ENCOUNTER — Encounter: Payer: Self-pay | Admitting: Medical

## 2019-03-07 ENCOUNTER — Ambulatory Visit: Payer: BLUE CROSS/BLUE SHIELD | Admitting: Medical

## 2019-03-07 ENCOUNTER — Other Ambulatory Visit: Payer: Self-pay

## 2019-03-07 VITALS — Temp 98.0°F | Ht 74.0 in | Wt 173.0 lb

## 2019-03-07 DIAGNOSIS — R0602 Shortness of breath: Secondary | ICD-10-CM | POA: Diagnosis not present

## 2019-03-07 DIAGNOSIS — R059 Cough, unspecified: Secondary | ICD-10-CM

## 2019-03-07 DIAGNOSIS — R05 Cough: Secondary | ICD-10-CM

## 2019-03-07 MED ORDER — ALBUTEROL SULFATE HFA 108 (90 BASE) MCG/ACT IN AERS: 2.0000 | INHALATION_SPRAY | Freq: Four times a day (QID) | RESPIRATORY_TRACT | 0 refills | Status: AC | PRN

## 2019-03-07 NOTE — Progress Notes (Signed)
Subjective: This visit type was conducted due to national recommendations for restrictions regarding the COVID-19 Pandemic (e.g. social distancing) in an effort to limit this patient's exposure and mitigate transmission in our community.  Due to their co-morbid illnesses, this patient is at least at moderate risk for complications without adequate follow up.  This format is felt to be most appropriate for this patient at this time.    Documentation for virtual audio and video telecommunications through Zoom encounter:  The patient was located at home. The provider was located in the office. The patient did consent to this visit and is aware of possible charges through their insurance for this visit.  The other persons participating in this telemedicine service were none. Time spent on call was 15 minutes and in review of previous records >15 minutes total.  This virtual service is not related to other E/M service within previous 7 days.   Chief Complaint  Patient presents with  . Cough    woth SOB since last Saturday    He reports 6-day history of cough, some shortness of breath particular going up and down stairs, some nausea, abdomen out from coughing, had runny nose and sneezing but that cleared up, mild headache, but no fever, denies nausea vomiting, no change in taste or smell.  Wife had similar symptoms before he started symptoms patient tested negative.  He did have a COVID test drawn 2 days ago and is awaiting results.  He denies any specific contacts or other sick contacts.  He is using Mucinex/guaifenesin as well as DayQuil and NyQuil is helping.  He is no worse or better than earlier this week.   He is a smoker.  He would like a refill on albuterol.  He has a history of childhood asthma.  He rarely gets sick but would like to see if albuterol would help the shortness of breath currently.  No other aggravating or relieving factors. No other complaint.   No past medical history on  file.  Current Outpatient Medications on File Prior to Visit  Medication Sig Dispense Refill  . amphetamine-dextroamphetamine (ADDERALL) 10 MG tablet 1-1/2 pills in the morning and 1 in the afternoon 75 tablet 0  . amphetamine-dextroamphetamine (ADDERALL) 10 MG tablet 1 and half pills in the morning and 1 in the afternoon 75 tablet 0  . amphetamine-dextroamphetamine (ADDERALL) 10 MG tablet 1-1/2 in the morning and 1 afternoon 75 tablet 0  . cetirizine (ZYRTEC) 10 MG tablet Take 10 mg by mouth daily. Reported on 12/09/2015    . loratadine (CLARITIN) 10 MG tablet Take 10 mg by mouth daily as needed for allergies. Reported on 12/09/2015     No current facility-administered medications on file prior to visit.    ROS as in subjective   Objective: Temp 98 F (36.7 C)   Ht 6\' 2"  (1.88 m)   Wt 173 lb (78.5 kg)   BMI 22.21 kg/m   Not examined in person nad     Assessment: Encounter Diagnoses  Name Primary?  . Cough Yes  . SOB (shortness of breath)     Plan: We discussed his concerns, symptoms, likely viral URI.  He should get his covid test result back within next 48 hours.  Advised rest, hydration, medication below, can c/t OTC dayquil/Nyquil and guaifenesin.   If worse in the next 48 hours, call back.  Self quarantine for now as discussed.  Advise him to send [place] copy of test result.   Johnel was seen  today for cough.  Diagnoses and all orders for this visit:  Cough  SOB (shortness of breath)  Other orders -     albuterol (VENTOLIN HFA) 108 (90 Base) MCG/ACT inhaler; Inhale 2 puffs into the lungs every 6 (six) hours as needed for wheezing or shortness of breath.

## 2019-03-11 ENCOUNTER — Encounter: Payer: Self-pay | Admitting: Medical

## 2019-03-11 ENCOUNTER — Ambulatory Visit (INDEPENDENT_AMBULATORY_CARE_PROVIDER_SITE_OTHER): Payer: BLUE CROSS/BLUE SHIELD | Admitting: Medical

## 2019-03-11 ENCOUNTER — Other Ambulatory Visit: Payer: Self-pay

## 2019-03-11 VITALS — Temp 98.6°F | Ht 74.0 in | Wt 173.0 lb

## 2019-03-11 DIAGNOSIS — J40 Bronchitis, not specified as acute or chronic: Secondary | ICD-10-CM

## 2019-03-11 DIAGNOSIS — R05 Cough: Secondary | ICD-10-CM

## 2019-03-11 DIAGNOSIS — R059 Cough, unspecified: Secondary | ICD-10-CM

## 2019-03-11 DIAGNOSIS — J329 Chronic sinusitis, unspecified: Secondary | ICD-10-CM

## 2019-03-11 DIAGNOSIS — F17200 Nicotine dependence, unspecified, uncomplicated: Secondary | ICD-10-CM

## 2019-03-11 MED ORDER — AZITHROMYCIN 250 MG PO TABS: ORAL_TABLET | ORAL | 0 refills | Status: DC

## 2019-03-11 NOTE — Progress Notes (Signed)
Subjective: This visit type was conducted due to national recommendations for restrictions regarding the COVID-19 Pandemic (e.g. social distancing) in an effort to limit this patient's exposure and mitigate transmission in our community.  Due to their co-morbid illnesses, this patient is at least at moderate risk for complications without adequate follow up.  This format is felt to be most appropriate for this patient at this time.    Documentation for virtual audio and video telecommunications through Zoom encounter:  The patient was located at home. The provider was located in the office. The patient did consent to this visit and is aware of possible charges through their insurance for this visit.  The other persons participating in this telemedicine service were none. Time spent on call was 15 minutes and in review of previous records >15 minutes total.  This virtual service IS related to other E/M service within previous 7 days.   Chief Complaint  Patient presents with  . Cough    x10 days with negative covid result today-was tested last Tuesday    Virtual consult today from last week's appt for cough.   Since last week his Covid test came back negative.  However, symptoms persist and seem to be worse in chest, productive cough now and ongoing sinus pressure.  This is day 10 of symptoms.   Cough hasn't resolved, feels heavy in chest, has had pneumonia before, however, this feels more like bronchitis.     Last week when we talked 4 days ago he noted 6-day history of cough, some shortness of breath particular going up and down stairs, some nausea, abdomen out from coughing, had runny nose and sneezing but that cleared up, mild headache, but no fever, denies nausea vomiting, no change in taste or smell.  Wife had similar symptoms before he started symptoms patient tested negative.  He is using Mucinex/guaifenesin as well as DayQuil and NyQuil is helping.  He is no worse or better than earlier  this week.   He is a smoker.    He is using the albuterol with some improvement in cough symtpoms.  No other aggravating or relieving factors. No other complaint.   No past medical history on file.  Current Outpatient Medications on File Prior to Visit  Medication Sig Dispense Refill  . albuterol (VENTOLIN HFA) 108 (90 Base) MCG/ACT inhaler Inhale 2 puffs into the lungs every 6 (six) hours as needed for wheezing or shortness of breath. 8 g 0  . amphetamine-dextroamphetamine (ADDERALL) 10 MG tablet 1-1/2 pills in the morning and 1 in the afternoon 75 tablet 0  . amphetamine-dextroamphetamine (ADDERALL) 10 MG tablet 1 and half pills in the morning and 1 in the afternoon 75 tablet 0  . amphetamine-dextroamphetamine (ADDERALL) 10 MG tablet 1-1/2 in the morning and 1 afternoon 75 tablet 0  . cetirizine (ZYRTEC) 10 MG tablet Take 10 mg by mouth daily. Reported on 12/09/2015    . loratadine (CLARITIN) 10 MG tablet Take 10 mg by mouth daily as needed for allergies. Reported on 12/09/2015     No current facility-administered medications on file prior to visit.    ROS as in subjective   Objective: Temp 98.6 F (37 C)   Ht 6\' 2"  (1.88 m)   Wt 173 lb (78.5 kg)   BMI 22.21 kg/m   Not examined in person      Assessment: Encounter Diagnoses  Name Primary?  . Sinobronchitis Yes  . Cough   . Smoker     Plan:  We discussed his concerns, symptoms.  Begin Zpak, rest, hydrate well, c/t albuterol 2 puffs every 4-6 hours prn, and if not much improved by end of this week, call back.     Marcus Cuevas was seen today for cough.  Diagnoses and all orders for this visit:  Sinobronchitis  Cough  Smoker  Other orders -     azithromycin (ZITHROMAX) 250 MG tablet; 2 tablets day 1, then 1 tablet days 2-4

## 2020-06-27 ENCOUNTER — Other Ambulatory Visit: Payer: Self-pay

## 2022-02-16 ENCOUNTER — Encounter: Payer: Self-pay | Admitting: Internal Medicine

## 2022-03-22 ENCOUNTER — Encounter: Payer: Self-pay | Admitting: Internal Medicine

## 2022-04-04 ENCOUNTER — Encounter: Payer: Self-pay | Admitting: Internal Medicine

## 2022-05-15 ENCOUNTER — Encounter (HOSPITAL_COMMUNITY): Payer: Self-pay | Admitting: Emergency Medicine

## 2022-05-15 ENCOUNTER — Ambulatory Visit (HOSPITAL_COMMUNITY)
Admission: EM | Admit: 2022-05-15 | Discharge: 2022-05-15 | Disposition: A | Discharge status: Home / Self Care | Payer: BLUE CROSS/BLUE SHIELD | Attending: Emergency Medicine | Admitting: Emergency Medicine

## 2022-05-15 ENCOUNTER — Other Ambulatory Visit: Payer: Self-pay

## 2022-05-15 DIAGNOSIS — Z1152 Encounter for screening for COVID-19: Secondary | ICD-10-CM | POA: Insufficient documentation

## 2022-05-15 DIAGNOSIS — H9203 Otalgia, bilateral: Secondary | ICD-10-CM | POA: Insufficient documentation

## 2022-05-15 DIAGNOSIS — H6123 Impacted cerumen, bilateral: Secondary | ICD-10-CM | POA: Diagnosis not present

## 2022-05-15 NOTE — ED Triage Notes (Addendum)
Left ear pain started Tuesday, but worsened  last night. Patient has had sinus drainage.  Left eye redness and pain.  Patient has taken ibuprofen and tylenol at home.  Patient has an appt with pcp tomorrow, but pain has been significant and is reason for coming today.    All complaints sre on left side of head.  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

## 2022-05-15 NOTE — Discharge Instructions (Signed)
Please follow up with your primary care provider tomorrow.  In the meantime, continue ibuprofen or tylenol for pain.

## 2022-05-15 NOTE — ED Provider Notes (Signed)
[facility]    CSN: [id number] Arrival date & time: 05/15/22  1018      History   Chief Complaint Chief Complaint  Patient presents with   Otalgia    HPI Marcus Cuevas is a 41 y.o. male.  Presents with left ear pain, left eye redness x 4 days Some left side headache occasionally Reports the ear and head pain will feel like jolt of pain, does not last longer than a second or two  No fevers. No cough. Slight runny nose but no sinus pressure Denies history of migraine or headaches He tried ibuprofen and tylenol that helped dull the sensation   He is not currently having headache, but ear is about 5/10 pain  History reviewed. No pertinent past medical history.  Patient Active Problem List   Diagnosis Date Noted   Other allergic rhinitis 02/10/2015   Current smoker 08/25/2014   Attention deficit hyperactivity disorder (ADHD), predominantly inattentive type 03/06/2013    History reviewed. No pertinent surgical history.     Home Medications    Prior to Admission medications   Medication Sig Start Date End Date Taking? Authorizing Provider  albuterol (VENTOLIN HFA) 108 (90 Base) MCG/ACT inhaler Inhale 2 puffs into the lungs every 6 (six) hours as needed for wheezing or shortness of breath. Patient not taking: Reported on 05/15/2022 03/07/19   Tysinger, Kermit Balo, PA-C  amphetamine-dextroamphetamine (ADDERALL) 10 MG tablet 1-1/2 pills in the morning and 1 in the afternoon Patient not taking: Reported on 05/15/2022 03/04/19   Ronnald Nian, MD  amphetamine-dextroamphetamine (ADDERALL) 10 MG tablet 1 and half pills in the morning and 1 in the afternoon Patient not taking: Reported on 05/15/2022 02/01/19   Ronnald Nian, MD  amphetamine-dextroamphetamine (ADDERALL) 10 MG tablet 1-1/2 in the morning and 1 afternoon Patient not taking: Reported on 05/15/2022 01/01/19   Ronnald Nian, MD  azithromycin (ZITHROMAX) 250 MG tablet 2 tablets day 1, then 1 tablet days  2-4 Patient not taking: Reported on 05/15/2022 03/11/19   Tysinger, Kermit Balo, PA-C  cetirizine (ZYRTEC) 10 MG tablet Take 10 mg by mouth daily. Reported on 12/09/2015 Patient not taking: Reported on 05/15/2022    [provider]  loratadine (CLARITIN) 10 MG tablet Take 10 mg by mouth daily as needed for allergies. Reported on 12/09/2015 Patient not taking: Reported on 05/15/2022    [provider]    Family History History reviewed. No pertinent family history.  Social History Social History   Tobacco Use   Smoking status: Some Days   Smokeless tobacco: Never  Vaping Use   Vaping Use: Never used  Substance Use Topics   Alcohol use: Yes    Comment: Rare   Drug use: No     Allergies   Amoxicillin   Review of Systems Review of Systems  HENT:  Positive for ear pain.    Per HPI  Physical Exam Triage Vital Signs ED Triage Vitals  Enc Vitals Group     BP 05/15/22 1136 133/78     Pulse Rate 05/15/22 1136 74     Resp 05/15/22 1136 20     Temp 05/15/22 1136 98 F (36.7 C)     Temp Source 05/15/22 1136 Oral     SpO2 05/15/22 1136 96 %     Weight --      Height --      Head Circumference --      Peak Flow --      Pain  Score 05/15/22 1132 2     Pain Loc --      Pain Edu? --      Excl. in GC? --    No data found.  Updated Vital Signs BP 133/78 (BP Location: Left Arm)   Pulse 74   Temp 98 F (36.7 C) (Oral)   Resp 20   SpO2 96%    Physical Exam Vitals and nursing note reviewed.  Constitutional:      General: He is not in acute distress.    Appearance: Normal appearance.  HENT:     Right Ear: There is impacted cerumen.     Left Ear: There is impacted cerumen.     Ears:     Comments: Hard impacted wax in bilat ears. Worse in L. After irrigation, canals are clear and no sign of infection behind TM    Nose: No congestion or rhinorrhea.     Mouth/Throat:     Mouth: Mucous membranes are moist.     Pharynx: Oropharynx is clear. No posterior  oropharyngeal erythema.  Eyes:     General: Lids are normal. Vision grossly intact.     Extraocular Movements: Extraocular movements intact.     Pupils: Pupils are equal, round, and reactive to light.     Comments: Left conjunctiva mildly injected. No discharge. No pain with eye movement.  Cardiovascular:     Rate and Rhythm: Normal rate and regular rhythm.     Pulses: Normal pulses.     Heart sounds: Normal heart sounds.  Pulmonary:     Effort: Pulmonary effort is normal.     Breath sounds: Normal breath sounds.  Skin:    General: Skin is warm and dry.  Neurological:     General: No focal deficit present.     Mental Status: He is alert and oriented to person, place, and time.     UC Treatments / Results  Labs (all labs ordered are listed, but only abnormal results are displayed) Labs Reviewed  SARS CORONAVIRUS 2 (TAT 6-24 HRS)    EKG  Radiology No results found.  Procedures Procedures (including critical care time)  Medications Ordered in UC Medications - No data to display  Initial Impression / Assessment and Plan / UC Course  I have reviewed the triage vital signs and the nursing notes.  Pertinent labs & imaging results that were available during my care of the patient were reviewed by me and considered in my medical decision making (see chart for details).  Wax removed by irrigation. May have improved ear pain but patient is not sure. No infection noted. Unknown why he has these jolts of pain but he did not experience them during visit. May or may not be related to the wax impaction. Symptoms not consistent with cluster headaches but consider migraine etiology. He does have PCP appointment tomorrow and will follow up with them. Covid test pending. Recommend using ibuprofen/tylenol as needed. Return precautions discussed. Patient agrees to plan  Final Clinical Impressions(s) / UC Diagnoses   Final diagnoses:  Bilateral impacted cerumen  Otalgia of both ears      Discharge Instructions      Please follow up with your primary care provider tomorrow.  In the meantime, continue ibuprofen or tylenol for pain.     ED Prescriptions   None    PDMP not reviewed this encounter.   Sahir Tolson, [place] 05/15/22 [phone]

## 2022-05-16 LAB — SARS CORONAVIRUS 2 (TAT 6-24 HRS): SARS Coronavirus 2: NEGATIVE

## 2022-05-23 ENCOUNTER — Ambulatory Visit: Payer: BLUE CROSS/BLUE SHIELD | Admitting: Family Medicine

## 2022-05-23 ENCOUNTER — Encounter: Payer: Self-pay | Admitting: Family Medicine

## 2022-05-23 VITALS — BP 128/80 | HR 72 | Temp 98.1°F | Ht 73.0 in | Wt 177.0 lb

## 2022-05-23 DIAGNOSIS — H1032 Unspecified acute conjunctivitis, left eye: Secondary | ICD-10-CM | POA: Diagnosis not present

## 2022-05-23 MED ORDER — POLYMYXIN B-TRIMETHOPRIM 10000-0.1 UNIT/ML-% OP SOLN: OPHTHALMIC | 0 refills | Status: AC

## 2022-05-23 NOTE — Patient Instructions (Signed)
This is not the typical presentation of a conjunctivitis. I do not see any corneal abrasion or ulcer. We prescribed an antibiotic drop. If your symptoms aren't improving in a couple of days, especially if your vision isn't improving as the eye feels less watery, then you need to see an eye doctor for further evaluation. If you have trouble getting an appointment, let us know and we can put in an urgent referral.

## 2022-05-23 NOTE — Progress Notes (Signed)
Chief Complaint  Patient presents with   Eye Problem    Two weeks ago Tuesday 05/10/22. Had a HA and pain behind left eye, went to [facility] 05/15/22 and had ears cleaned as they were impacted. HA is now gone, mostly. Less than a week ago left eye started getting red and some drainage/am crusting over. Used 40 year old daughter's eye drops that she used when she had pink eye.    He works outside.  He started having pain behind L eye, took some advil.  12/3 he went to UC with L ear pain and ongoing pain behind L eye.  Cerumen impaction was treated, and he states his eye pain improved.  He was told that he didn't have an ear infection or sinus infection, had a negative COVID test.  12/5 he noted the L eye turning red. Not itchy or painful.  Somewhat light sensitive.  Eye is watery, especially in the morning.  Yesterday morning his eye was stuck shut, today it was slightly crusty. He started using leftover antibiotic eye drops of his daughter's that are about a year old.  He reports his vision has been a bit hazy for the last 2-3 days, eye remains watery.  No symptoms on the right eye.  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 Doesn't wear contacts.  PMH, PSH, SH reviewed  Smoker  Outpatient Encounter Medications as of 05/23/2022  Medication Sig Note   albuterol (VENTOLIN HFA) 108 (90 Base) MCG/ACT inhaler Inhale 2 puffs into the lungs every 6 (six) hours as needed for wheezing or shortness of breath. (Patient not taking: Reported on 05/15/2022) 05/23/2022: prn   [DISCONTINUED] amphetamine-dextroamphetamine (ADDERALL) 10 MG tablet 1-1/2 pills in the morning and 1 in the afternoon (Patient not taking: Reported on 05/15/2022)    [DISCONTINUED] amphetamine-dextroamphetamine (ADDERALL) 10 MG tablet 1 and half pills in the morning and 1 in the afternoon (Patient not taking: Reported on 05/15/2022)    [DISCONTINUED] amphetamine-dextroamphetamine (ADDERALL) 10 MG tablet 1-1/2  in the morning and 1 afternoon (Patient not taking: Reported on 05/15/2022)    [DISCONTINUED] azithromycin (ZITHROMAX) 250 MG tablet 2 tablets day 1, then 1 tablet days 2-4 (Patient not taking: Reported on 05/15/2022)    [DISCONTINUED] cetirizine (ZYRTEC) 10 MG tablet Take 10 mg by mouth daily. Reported on 12/09/2015 (Patient not taking: Reported on 05/15/2022)    [DISCONTINUED] loratadine (CLARITIN) 10 MG tablet Take 10 mg by mouth daily as needed for allergies. Reported on 12/09/2015 (Patient not taking: Reported on 05/15/2022)    No facility-administered encounter medications on file as of 05/23/2022.   Allergies  Allergen Reactions   Amoxicillin    ROS: no fever, chills, URI symptoms.  No further ear pain. Only minimal headache (not having the pain he had prior to UC. No n/v/d. See HPI   PHYSICAL EXAM:  BP 128/80   Pulse 72   Temp 98.1 F (36.7 C) (Tympanic)   Ht 6\' 1"  (1.854 m)   Wt 177 lb (80.3 kg)   BMI 23.35 kg/m   Vision 20/70 on L, 20/20 on R and with both eyes together. Pleasant male in no distress HEENT: OP--Brown discoloration to tongue, otherwise normal. Normal TM's and EAC's, no cerumen. PERRL, EOMI. Moderate conjunctival injection on the left, patchier in some places (med/lateral) No hemorrhage. Unable to see fundi very clearly on the left, no hemorrhages noted. Right eye--conjunctiva and sclera are clear, fundi normal. Neck: no lymphadenopathy or mass  Fluorescein exam--mild diffuse  uptake, no focal abnormality.   ASSESSMENT/PLAN:  Acute conjunctivitis of left eye, unspecified acute conjunctivitis type - possible partially treated conjunctivitis (with daughter's drops). Suspect bacterial since hasn't spread to R. Needs to see ophtho this week if not improving - Plan: trimethoprim-polymyxin b (POLYTRIM) ophthalmic solution  Advised that this isn't a straightforward case of pinkeye. Having used ABX for a couple of days might be affecting things. Photophobia,  without ulcer/abrasion to cornea. Not really having eye pain now (as he did prior to ear lavage), which is reassuring. Advised that if he isn't seeing significant improvement in the next 1-2d, he needs to see ophtho.  He can contact us for urgent referral if unable to get an appointment on his own.  Encouraged to quit smoking.

## 2022-05-24 ENCOUNTER — Encounter: Payer: Self-pay | Admitting: Family Medicine

## 2022-05-24 NOTE — Addendum Note (Signed)
Addended by: Debbrah Alar F on: 05/24/2022 10:49 AM   Modules accepted: Orders

## 2022-05-24 NOTE — Progress Notes (Signed)
Visual acuity ordered and documented

## 2023-08-08 ENCOUNTER — Encounter: Payer: Self-pay | Admitting: Internal Medicine
